# Patient Record
Sex: Female | Born: 2000 | Hispanic: Yes | Marital: Single | Smoking: Never smoker
Health system: Southern US, Community
[De-identification: ages and names within clinical notes are randomized; demographics above are authoritative.]

---
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
## 2022-03-29 ENCOUNTER — Other Ambulatory Visit: Payer: Self-pay | Admitting: Family Medicine

## 2022-03-29 DIAGNOSIS — F411 Generalized anxiety disorder: Secondary | ICD-10-CM

## 2022-03-30 ENCOUNTER — Encounter: Payer: Self-pay | Admitting: Family Medicine

## 2022-03-30 NOTE — Telephone Encounter (Signed)
[place] NTBS 30 days given 12/04/21

## 2022-03-30 NOTE — Telephone Encounter (Signed)
LMTCB TO SCHEDULE APPT LETTER MAILED 

## 2022-04-10 DIAGNOSIS — H5213 Myopia, bilateral: Secondary | ICD-10-CM | POA: Diagnosis not present

## 2022-05-04 ENCOUNTER — Ambulatory Visit (INDEPENDENT_AMBULATORY_CARE_PROVIDER_SITE_OTHER): Payer: Medicaid Other | Admitting: Family Medicine

## 2022-05-04 ENCOUNTER — Encounter: Payer: Self-pay | Admitting: Family Medicine

## 2022-05-04 VITALS — BP 117/73 | HR 57 | Ht 60.0 in | Wt 143.0 lb

## 2022-05-04 DIAGNOSIS — F411 Generalized anxiety disorder: Secondary | ICD-10-CM

## 2022-05-04 DIAGNOSIS — F339 Major depressive disorder, recurrent, unspecified: Secondary | ICD-10-CM | POA: Diagnosis not present

## 2022-05-04 DIAGNOSIS — Z23 Encounter for immunization: Secondary | ICD-10-CM | POA: Diagnosis not present

## 2022-05-04 MED ORDER — FLUOXETINE HCL 20 MG PO CAPS: 20.0000 mg | ORAL_CAPSULE | Freq: Every day | ORAL | 2 refills | Status: DC

## 2022-05-04 MED ORDER — HYDROXYZINE HCL 10 MG PO TABS: 10.0000 mg | ORAL_TABLET | Freq: Three times a day (TID) | ORAL | 0 refills | Status: DC | PRN
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
## 2022-05-29 ENCOUNTER — Encounter: Payer: Self-pay | Admitting: Family Medicine

## 2022-05-29 ENCOUNTER — Ambulatory Visit (INDEPENDENT_AMBULATORY_CARE_PROVIDER_SITE_OTHER): Payer: Medicaid Other | Admitting: Family Medicine

## 2022-05-29 VITALS — BP 118/76 | HR 60 | Ht 60.0 in | Wt 143.0 lb

## 2022-05-29 DIAGNOSIS — F339 Major depressive disorder, recurrent, unspecified: Secondary | ICD-10-CM | POA: Diagnosis not present

## 2022-05-29 DIAGNOSIS — F411 Generalized anxiety disorder: Secondary | ICD-10-CM | POA: Diagnosis not present

## 2022-05-29 MED ORDER — HYDROXYZINE HCL 10 MG PO TABS: 10.0000 mg | ORAL_TABLET | Freq: Three times a day (TID) | ORAL | 0 refills | Status: DC | PRN

## 2022-05-29 MED ORDER — FLUOXETINE HCL 20 MG PO CAPS: 20.0000 mg | ORAL_CAPSULE | Freq: Every day | ORAL | 1 refills | Status: DC

## 2022-05-29 NOTE — Progress Notes (Signed)
BP 118/76   Pulse 60   Ht 5' (1.524 m)   Wt 143 lb (64.9 kg)   SpO2 98%   BMI 27.93 kg/m    Subjective:   Patient ID: Holly Ortega, female    DOB: 11-01-2000, 22 y.o.   MRN: [id number]  HPI: Holly Ortega is a 22 y.o. female presenting on 05/29/2022 for Medical Management of Chronic Issues and Depression (Pt states that fluoxetine and hydroxyzine are helpful)   HPI Anxiety depression recheck Patient is coming in for anxiety depression recheck, she is currently on Prozac and using hydroxyzine as needed.  She feels like she is doing better, she feels like her anxiety is a lot better.  She feels like she is not having panic attacks or overwhelming anxiety like she was before and has only had to use the hydroxyzine twice in the past month and the most recent was 2 weeks ago.  She denies any suicidal ideations or thoughts of hurting herself.  She is still thinking about counseling and will consider it.    05/04/2022    3:36 PM 10/06/2021    4:20 PM 10/06/2021    3:51 PM 05/16/2021    8:52 AM 01/03/2021    4:54 PM  Depression screen PHQ 2/9  Decreased Interest 1 1 0 3 2  Down, Depressed, Hopeless 1 2 0 1 2  PHQ - 2 Score 2 3 0 4 4  Altered sleeping 2 3  3 3   Tired, decreased energy 3 3  3 3   Change in appetite 3 3  1 3   Feeling bad or failure about yourself  0 1  0 3  Trouble concentrating 3 3  3 3   Moving slowly or fidgety/restless 3 3  1 3   Suicidal thoughts 0 0  0 0  PHQ-9 Score 16 19  15 22   Difficult doing work/chores Very difficult Very difficult  Very difficult      Relevant past medical, surgical, family and social history reviewed and updated as indicated. Interim medical history since our last visit reviewed. Allergies and medications reviewed and updated.  Review of Systems  Constitutional:  Negative for chills and fever.  Eyes:  Negative for redness and visual disturbance.  Respiratory:  Negative for chest tightness and shortness of  breath.   Cardiovascular:  Negative for chest pain and leg swelling.  Musculoskeletal:  Negative for back pain and gait problem.  Skin:  Negative for rash.  Neurological:  Negative for dizziness, light-headedness and headaches.  Psychiatric/Behavioral:  Positive for dysphoric mood. Negative for agitation and behavioral problems. The patient is nervous/anxious.   All other systems reviewed and are negative.   Per HPI unless specifically indicated above   Allergies as of 05/29/2022   No Known Allergies      Medication List        Accurate as of May 29, 2022  3:27 PM. If you have any questions, ask your nurse or doctor.          FLUoxetine 20 MG capsule Commonly known as: PROzac Take 1 capsule (20 mg total) by mouth daily.   hydrOXYzine 10 MG tablet Commonly known as: ATARAX Take 1 tablet (10 mg total) by mouth 3 (three) times daily as needed.         Objective:   BP 118/76   Pulse 60   Ht 5' (1.524 m)   Wt 143 lb (64.9 kg)   SpO2 98%   BMI 27.93  kg/m   Wt Readings from Last 3 Encounters:  05/29/22 143 lb (64.9 kg) (73 %, Z= 0.60)*  05/04/22 143 lb (64.9 kg) (73 %, Z= 0.61)*  10/06/21 149 lb 9.6 oz (67.9 kg) (81 %, Z= 0.88)*   * Growth percentiles are based on CDC (Girls, 2-20 Years) data.    Physical Exam Vitals and nursing note reviewed.  Constitutional:      General: She is not in acute distress.    Appearance: She is well-developed. She is not diaphoretic.  Eyes:     Conjunctiva/sclera: Conjunctivae normal.  Cardiovascular:     Rate and Rhythm: Normal rate and regular rhythm.     Heart sounds: Normal heart sounds. No murmur heard. Pulmonary:     Effort: Pulmonary effort is normal. No respiratory distress.     Breath sounds: Normal breath sounds. No wheezing.  Musculoskeletal:        General: No swelling or tenderness. Normal range of motion.  Skin:    General: Skin is warm and dry.     Findings: No rash.  Neurological:     Mental Status:  She is alert and oriented to person, place, and time.     Coordination: Coordination normal.  Psychiatric:        Behavior: Behavior normal.       Assessment & Plan:   Problem List Items Addressed This Visit       Other   GAD (generalized anxiety disorder) - Primary   Relevant Medications   FLUoxetine (PROZAC) 20 MG capsule   hydrOXYzine (ATARAX) 10 MG tablet   Other Visit Diagnoses     Depression, recurrent ([facility])       Relevant Medications   FLUoxetine (PROZAC) 20 MG capsule   hydrOXYzine (ATARAX) 10 MG tablet       Continue current medicine, seems to be doing well.  No changes.  Discussed counseling again and will follow-up soon. Follow up plan: Return if symptoms worsen or fail to improve, for 1-2 months anxiety.  Counseling provided for all of the vaccine components No orders of the defined types were placed in this encounter.   Caryl Pina, MD [facility] 05/29/2022, 3:27 PM

## 2022-08-06 ENCOUNTER — Ambulatory Visit: Payer: Medicaid Other | Admitting: Family Medicine

## 2022-09-24 ENCOUNTER — Ambulatory Visit: Payer: Medicaid Other | Admitting: Family Medicine

## 2022-10-14 ENCOUNTER — Ambulatory Visit: Payer: Medicaid Other | Admitting: Family Medicine

## 2022-12-09 ENCOUNTER — Encounter: Payer: Self-pay | Admitting: Family Medicine

## 2022-12-09 ENCOUNTER — Ambulatory Visit: Payer: Medicaid Other | Admitting: Family Medicine

## 2022-12-09 VITALS — BP 104/70 | HR 60 | Ht 60.0 in | Wt 148.0 lb

## 2022-12-09 DIAGNOSIS — F339 Major depressive disorder, recurrent, unspecified: Secondary | ICD-10-CM | POA: Diagnosis not present

## 2022-12-09 DIAGNOSIS — Z23 Encounter for immunization: Secondary | ICD-10-CM

## 2022-12-09 DIAGNOSIS — F411 Generalized anxiety disorder: Secondary | ICD-10-CM

## 2022-12-09 MED ORDER — FLUOXETINE HCL 20 MG PO CAPS: 20.0000 mg | ORAL_CAPSULE | Freq: Every day | ORAL | 3 refills | Status: AC

## 2022-12-09 MED ORDER — HYDROXYZINE HCL 10 MG PO TABS: 10.0000 mg | ORAL_TABLET | Freq: Three times a day (TID) | ORAL | 6 refills | Status: AC | PRN

## 2022-12-09 NOTE — Progress Notes (Signed)
BP 104/70   Pulse 60   Ht 5' (1.524 m)   Wt 148 lb (67.1 kg)   SpO2 97%   BMI 28.90 kg/m    Subjective:   Patient ID: Holly Ortega, female    DOB: 02-Oct-2000, 22 y.o.   MRN: [id number]  HPI: Holly Ortega is a 22 y.o. female presenting on 12/09/2022 for Medical Management of Chronic Issues, Anxiety, and Depression   HPI Anxiety recheck Patient is coming in today for anxiety recheck.  She is currently taking Prozac and uses hydroxyzine as needed for anxiety attacks.  She has been out of her medicines for about 5 months she says and she has noticed a difference in her anxiety been a lot higher and having issues.  She does still finish with school and has been doing well in her job but still says she has some anxiety wants to go back on medicine.    12/09/2022   11:38 AM 05/29/2022    3:28 PM 05/04/2022    3:36 PM 10/06/2021    4:20 PM 10/06/2021    3:51 PM  Depression screen PHQ 2/9  Decreased Interest 1 1 1 1  0  Down, Depressed, Hopeless 1 1 1 2  0  PHQ - 2 Score 2 2 2 3  0  Altered sleeping 3 2 2 3    Tired, decreased energy 3 1 3 3    Change in appetite 1 2 3 3    Feeling bad or failure about yourself  1 0 0 1   Trouble concentrating 0 3 3 3    Moving slowly or fidgety/restless 0 0 3 3   Suicidal thoughts 0 0 0 0   PHQ-9 Score 10 10 16 19    Difficult doing work/chores Somewhat difficult Somewhat difficult Very difficult Very difficult      Relevant past medical, surgical, family and social history reviewed and updated as indicated. Interim medical history since our last visit reviewed. Allergies and medications reviewed and updated.  Review of Systems  Constitutional:  Negative for chills and fever.  Eyes:  Negative for redness and visual disturbance.  Respiratory:  Negative for chest tightness and shortness of breath.   Cardiovascular:  Negative for chest pain and leg swelling.  Skin:  Negative for rash.  Neurological:  Negative for  light-headedness and headaches.  Psychiatric/Behavioral:  Positive for dysphoric mood. Negative for agitation, behavioral problems, self-injury, sleep disturbance and suicidal ideas. The patient is nervous/anxious.   All other systems reviewed and are negative.   Per HPI unless specifically indicated above   Allergies as of 12/09/2022   No Known Allergies      Medication List        Accurate as of December 09, 2022 12:03 PM. If you have any questions, ask your nurse or doctor.          FLUoxetine 20 MG capsule Commonly known as: PROzac Take 1 capsule (20 mg total) by mouth daily.   hydrOXYzine 10 MG tablet Commonly known as: ATARAX Take 1 tablet (10 mg total) by mouth 3 (three) times daily as needed.         Objective:   BP 104/70   Pulse 60   Ht 5' (1.524 m)   Wt 148 lb (67.1 kg)   SpO2 97%   BMI 28.90 kg/m   Wt Readings from Last 3 Encounters:  12/09/22 148 lb (67.1 kg)  05/29/22 143 lb (64.9 kg) (73%, Z= 0.60)*  05/04/22 143 lb (64.9 kg) (  73%, Z= 0.61)*   * Growth percentiles are based on CDC (Girls, 2-20 Years) data.    Physical Exam Vitals and nursing note reviewed.  Constitutional:      General: She is not in acute distress.    Appearance: She is well-developed. She is not diaphoretic.  Eyes:     Conjunctiva/sclera: Conjunctivae normal.  Cardiovascular:     Rate and Rhythm: Normal rate and regular rhythm.     Heart sounds: Normal heart sounds. No murmur heard. Pulmonary:     Effort: Pulmonary effort is normal. No respiratory distress.     Breath sounds: Normal breath sounds. No wheezing.  Musculoskeletal:        General: No swelling. Normal range of motion.  Skin:    General: Skin is warm and dry.     Findings: No rash.  Neurological:     Mental Status: She is alert and oriented to person, place, and time.     Coordination: Coordination normal.  Psychiatric:        Behavior: Behavior normal.       Assessment & Plan:   Problem List  Items Addressed This Visit       Other   GAD (generalized anxiety disorder) - Primary   Relevant Medications   FLUoxetine (PROZAC) 20 MG capsule   hydrOXYzine (ATARAX) 10 MG tablet   Other Visit Diagnoses     Depression, recurrent ([facility])       Relevant Medications   FLUoxetine (PROZAC) 20 MG capsule   hydrOXYzine (ATARAX) 10 MG tablet     Refill medicine for including Prozac and hydroxyzine, seems to be doing well.   Follow up plan: Return in about 7 months (around 07/12/2023), or if symptoms worsen or fail to improve, for Well woman exam and Pap smear and recheck anxiety and depression.  Counseling provided for all of the vaccine components No orders of the defined types were placed in this encounter.   Arville Care, MD [facility] 12/09/2022, 12:03 PM

## 2022-12-22 DIAGNOSIS — R0981 Nasal congestion: Secondary | ICD-10-CM | POA: Diagnosis not present

## 2022-12-22 DIAGNOSIS — J069 Acute upper respiratory infection, unspecified: Secondary | ICD-10-CM | POA: Diagnosis not present

## 2023-01-19 ENCOUNTER — Other Ambulatory Visit: Payer: Self-pay | Admitting: Family Medicine

## 2023-01-19 ENCOUNTER — Ambulatory Visit (INDEPENDENT_AMBULATORY_CARE_PROVIDER_SITE_OTHER): Payer: Medicaid Other | Admitting: Family Medicine

## 2023-01-19 ENCOUNTER — Encounter: Payer: Self-pay | Admitting: Family Medicine

## 2023-01-19 VITALS — BP 98/63 | HR 73 | Temp 97.3°F | Ht 60.0 in | Wt 150.2 lb

## 2023-01-19 DIAGNOSIS — R300 Dysuria: Secondary | ICD-10-CM

## 2023-01-19 LAB — URINALYSIS, COMPLETE
Bilirubin, UA: NEGATIVE
Glucose, UA: NEGATIVE
Ketones, UA: NEGATIVE
Nitrite, UA: NEGATIVE
RBC, UA: NEGATIVE
Specific Gravity, UA: 1.02 (ref 1.005–1.030)
Urobilinogen, Ur: 0.2 mg/dL (ref 0.2–1.0)
pH, UA: 8.5 — ABNORMAL HIGH (ref 5.0–7.5)

## 2023-01-19 LAB — MICROSCOPIC EXAMINATION
RBC, Urine: NONE SEEN /[HPF] (ref 0–2)
Renal Epithel, UA: NONE SEEN /[HPF]
Yeast, UA: NONE SEEN

## 2023-01-19 MED ORDER — SULFAMETHOXAZOLE-TRIMETHOPRIM 800-160 MG PO TABS: 1.0000 | ORAL_TABLET | Freq: Two times a day (BID) | ORAL | 0 refills | Status: DC

## 2023-01-19 NOTE — Progress Notes (Signed)
Subjective:  Patient ID: Holly Ortega, female    DOB: 24-Jun-2000  Age: 21 y.o. MRN: [id number]  CC: Urinary Tract Infection   HPI Holly Ortega presents for 1-2 weeks of incomplete emptying, frequency. No pain. Occ. Urgency. No fever. Getting nauseated 2-3 times a week.      01/19/2023    3:12 PM 01/19/2023    2:35 PM 12/09/2022   11:38 AM  Depression screen PHQ 2/9  Decreased Interest 3 0 1  Down, Depressed, Hopeless 1 0 1  PHQ - 2 Score 4 0 2  Altered sleeping 3  3  Tired, decreased energy 1  3  Change in appetite 0  1  Feeling bad or failure about yourself  1  1  Trouble concentrating 2  0  Moving slowly or fidgety/restless 1  0  Suicidal thoughts 0  0  PHQ-9 Score 12  10  Difficult doing work/chores Somewhat difficult  Somewhat difficult    History Holly Ortega has no past medical history on file.   She has no past surgical history on file.   Her family history is not on file.She reports that she has never smoked. She has never used smokeless tobacco. She reports that she does not drink alcohol and does not use drugs.    ROS Review of Systems  Constitutional: Negative.   HENT: Negative.    Eyes:  Negative for visual disturbance.  Respiratory:  Negative for shortness of breath.   Cardiovascular:  Negative for chest pain.  Gastrointestinal:  Negative for abdominal pain.  Musculoskeletal:  Negative for arthralgias.    Objective:  BP 98/63   Pulse 73   Temp (!) 97.3 F (36.3 C)   Ht 5' (1.524 m)   Wt 150 lb 3.2 oz (68.1 kg)   SpO2 92%   BMI 29.33 kg/m   BP Readings from Last 3 Encounters:  01/19/23 98/63  12/09/22 104/70  05/29/22 118/76    Wt Readings from Last 3 Encounters:  01/19/23 150 lb 3.2 oz (68.1 kg)  12/09/22 148 lb (67.1 kg)  05/29/22 143 lb (64.9 kg) (73%, Z= 0.60)*   * Growth percentiles are based on CDC (Girls, 2-20 Years) data.     Physical Exam Constitutional:      General: She is not in acute  distress.    Appearance: She is well-developed.  Cardiovascular:     Rate and Rhythm: Normal rate and regular rhythm.  Pulmonary:     Breath sounds: Normal breath sounds.  Musculoskeletal:        General: Normal range of motion.  Skin:    General: Skin is warm and dry.  Neurological:     Mental Status: She is alert and oriented to person, place, and time.       Assessment & Plan:   Holly Ortega was seen today for urinary tract infection.  Diagnoses and all orders for this visit:  Dysuria -     Urinalysis, Complete -     Urine Culture  Other orders -     sulfamethoxazole-trimethoprim (BACTRIM DS) 800-160 MG tablet; Take 1 tablet by mouth 2 (two) times daily.       I am having Holly Ortega start on sulfamethoxazole-trimethoprim. I am also having her maintain her FLUoxetine and hydrOXYzine.  Allergies as of 01/19/2023   No Known Allergies      Medication List        Accurate as of January 19, 2023  4:21 PM.  If you have any questions, ask your nurse or doctor.          FLUoxetine 20 MG capsule Commonly known as: PROzac Take 1 capsule (20 mg total) by mouth daily.   hydrOXYzine 10 MG tablet Commonly known as: ATARAX Take 1 tablet (10 mg total) by mouth 3 (three) times daily as needed.   sulfamethoxazole-trimethoprim 800-160 MG tablet Commonly known as: BACTRIM DS Take 1 tablet by mouth 2 (two) times daily. Started by: Broadus John Zarion Oliff         Follow-up: Return if symptoms worsen or fail to improve.  Mechele Claude, M.D.

## 2023-01-21 LAB — URINE CULTURE: Organism ID, Bacteria: NO GROWTH

## 2023-01-25 ENCOUNTER — Encounter: Payer: Self-pay | Admitting: Family Medicine

## 2023-01-25 ENCOUNTER — Ambulatory Visit: Payer: Medicaid Other | Admitting: Family

## 2023-01-25 ENCOUNTER — Encounter: Payer: Self-pay | Admitting: Family

## 2023-01-25 VITALS — BP 112/70 | HR 102 | Temp 98.2°F | Ht 60.0 in | Wt 147.8 lb

## 2023-01-25 DIAGNOSIS — J069 Acute upper respiratory infection, unspecified: Secondary | ICD-10-CM | POA: Diagnosis not present

## 2023-01-25 DIAGNOSIS — R509 Fever, unspecified: Secondary | ICD-10-CM | POA: Diagnosis not present

## 2023-01-25 LAB — VERITOR FLU A/B WAIVED
Influenza A: NEGATIVE
Influenza B: NEGATIVE

## 2023-01-25 LAB — RAPID STREP SCREEN (MED CTR MEBANE ONLY): Strep Gp A Ag, IA W/Reflex: NEGATIVE

## 2023-01-25 LAB — RSV AG, IMMUNOCHR, WAIVED: RSV Ag, Immunochr, Waived: NEGATIVE

## 2023-01-25 LAB — CULTURE, GROUP A STREP

## 2023-01-25 MED ORDER — CETIRIZINE HCL 10 MG PO TABS: 10.0000 mg | ORAL_TABLET | Freq: Every day | ORAL | 1 refills | Status: DC

## 2023-01-25 MED ORDER — FLUTICASONE PROPIONATE 50 MCG/ACT NA SUSP: 2.0000 | Freq: Every day | NASAL | 6 refills | Status: DC

## 2023-01-25 MED ORDER — BENZONATATE 200 MG PO CAPS: 200.0000 mg | ORAL_CAPSULE | Freq: Three times a day (TID) | ORAL | 1 refills | Status: DC | PRN

## 2023-01-25 MED ORDER — ALBUTEROL SULFATE HFA 108 (90 BASE) MCG/ACT IN AERS: 2.0000 | INHALATION_SPRAY | Freq: Four times a day (QID) | RESPIRATORY_TRACT | 0 refills | Status: DC | PRN

## 2023-01-25 NOTE — Patient Instructions (Signed)

## 2023-01-25 NOTE — Progress Notes (Signed)
Subjective:    Patient ID: Holly Ortega, female    DOB: April 16, 2000, 22 y.o.   MRN: [id number]  Chief Complaint  Patient presents with   Fever   Sore Throat   Generalized Body Aches   PT presents to the office today with cough, sore throat, and body aches that started 4 days ago.  Fever  Associated symptoms include congestion, coughing and headaches. Pertinent negatives include no ear pain.  Sore Throat  This is a new problem. The current episode started in the past 7 days. The problem has been gradually worsening. The pain is worse on the left side. Maximum temperature: felt hot. The pain is at a severity of 5/10. The pain is moderate. Associated symptoms include congestion, coughing, headaches, shortness of breath, swollen glands and trouble swallowing. Pertinent negatives include no ear discharge or ear pain. She has tried acetaminophen and NSAIDs for the symptoms. The treatment provided mild relief.      Review of Systems  Constitutional:  Positive for fever.  HENT:  Positive for congestion and trouble swallowing. Negative for ear discharge and ear pain.   Respiratory:  Positive for cough and shortness of breath.   Neurological:  Positive for headaches.  All other systems reviewed and are negative.      Objective:   Physical Exam Vitals reviewed.  Constitutional:      General: She is not in acute distress.    Appearance: She is well-developed.  HENT:     Head: Normocephalic and atraumatic.     Comments: Tonsils erythemas     Right Ear: Tympanic membrane normal.     Left Ear: There is impacted cerumen.     Nose: Congestion and rhinorrhea present.  Eyes:     Pupils: Pupils are equal, round, and reactive to light.  Neck:     Thyroid: No thyromegaly.  Cardiovascular:     Rate and Rhythm: Normal rate and regular rhythm.     Heart sounds: Normal heart sounds. No murmur heard. Pulmonary:     Effort: Pulmonary effort is normal. No respiratory distress.      Breath sounds: Normal breath sounds. No wheezing.     Comments: Intermittent coarse nonproductive cough Abdominal:     General: Bowel sounds are normal. There is no distension.     Palpations: Abdomen is soft.     Tenderness: There is no abdominal tenderness.  Musculoskeletal:        General: No tenderness. Normal range of motion.     Cervical back: Normal range of motion and neck supple.  Skin:    General: Skin is warm and dry.  Neurological:     Mental Status: She is alert and oriented to person, place, and time.     Cranial Nerves: No cranial nerve deficit.     Deep Tendon Reflexes: Reflexes are normal and symmetric.  Psychiatric:        Behavior: Behavior normal.        Thought Content: Thought content normal.        Judgment: Judgment normal.       BP 112/70   Pulse (!) 102   Temp 98.2 F (36.8 C) (Temporal)   Ht 5' (1.524 m)   Wt 147 lb 12.8 oz (67 kg)   SpO2 93%   BMI 28.87 kg/m      Assessment & Plan:  Holly Ortega comes in today with chief complaint of Fever, Sore Throat, and Generalized Body Aches  Diagnosis and orders addressed:  1. Fever, unspecified fever cause - Novel Coronavirus, NAA ([facility]) - Veritor Flu A/B Waived - Rapid Strep Screen ([facility] ONLY) - RSV Ag, Immunochr, Waived  2. Viral URI with cough - Take meds as prescribed - Use a cool mist humidifier  -Use saline nose sprays frequently -Force fluids -For any cough or congestion  Use plain Mucinex- regular strength or max strength is fine -For fever or aces or pains- take tylenol or ibuprofen. -Throat lozenges if help -Follow up if symptoms worsen or do not improve - albuterol (VENTOLIN HFA) 108 (90 Base) MCG/ACT inhaler; Inhale 2 puffs into the lungs every 6 (six) hours as needed for wheezing or shortness of breath.  Dispense: 8 g; Refill: 0 - benzonatate (TESSALON) 200 MG capsule; Take 1 capsule (200 mg total) by mouth 3 (three) times daily as needed.  Dispense:  30 capsule; Refill: 1 - cetirizine (ZYRTEC ALLERGY) 10 MG tablet; Take 1 tablet (10 mg total) by mouth daily.  Dispense: 90 tablet; Refill: 1 - fluticasone (FLONASE) 50 MCG/ACT nasal spray; Place 2 sprays into both nostrils daily.  Dispense: 16 g; Refill: 6   Jannifer Rodney, FNP

## 2023-01-26 LAB — NOVEL CORONAVIRUS, NAA: SARS-CoV-2, NAA: NOT DETECTED

## 2023-03-23 ENCOUNTER — Ambulatory Visit: Payer: Medicaid Other

## 2023-03-23 ENCOUNTER — Encounter: Payer: Self-pay | Admitting: Family

## 2023-03-23 VITALS — BP 108/75 | HR 77 | Temp 97.5°F | Ht 60.0 in | Wt 147.0 lb

## 2023-03-23 DIAGNOSIS — J209 Acute bronchitis, unspecified: Secondary | ICD-10-CM | POA: Diagnosis not present

## 2023-03-23 MED ORDER — PREDNISONE 10 MG (21) PO TBPK: ORAL_TABLET | ORAL | 0 refills | Status: DC

## 2023-03-23 MED ORDER — BENZONATATE 200 MG PO CAPS: 200.0000 mg | ORAL_CAPSULE | Freq: Three times a day (TID) | ORAL | 1 refills | Status: DC | PRN

## 2023-03-23 NOTE — Patient Instructions (Signed)

## 2023-03-23 NOTE — Progress Notes (Signed)
 Subjective:    Patient ID: Holly Ortega, female    DOB: 02-23-2001, 23 y.o.   MRN: [id number]  Chief Complaint  Patient presents with   Cough    Since November    Nasal Congestion   PT presents to the office today with cough and congestion. She was seen on 01/25/23 and was diagnosed with bronchitis. She as given albuterol , tessalon , zyrtec , and flonase . She reports she started feeling better. However, when it gets cold she will continue to get coughing spells.  Cough This is a new problem. The current episode started more than 1 month ago. The problem has been waxing and waning. The problem occurs every few minutes. The cough is Non-productive. Associated symptoms include ear pain, headaches, myalgias, shortness of breath and wheezing. Pertinent negatives include no chills, ear congestion, fever, nasal congestion, postnasal drip or sore throat. She has tried OTC inhaler and rest for the symptoms. The treatment provided mild relief.      Review of Systems  Constitutional:  Negative for chills and fever.  HENT:  Positive for ear pain. Negative for postnasal drip and sore throat.   Respiratory:  Positive for cough, shortness of breath and wheezing.   Musculoskeletal:  Positive for myalgias.  Neurological:  Positive for headaches.  All other systems reviewed and are negative.      Objective:   Physical Exam Vitals reviewed.  Constitutional:      General: She is not in acute distress.    Appearance: She is well-developed.  HENT:     Head: Normocephalic and atraumatic.     Right Ear: Tympanic membrane normal.     Left Ear: Tympanic membrane normal.  Eyes:     Pupils: Pupils are equal, round, and reactive to light.  Neck:     Thyroid: No thyromegaly.  Cardiovascular:     Rate and Rhythm: Normal rate and regular rhythm.     Heart sounds: Normal heart sounds. No murmur heard. Pulmonary:     Effort: Pulmonary effort is normal. No respiratory distress.     Breath  sounds: Normal breath sounds. No wheezing.     Comments: Dry nonproductive cough  Abdominal:     General: Bowel sounds are normal. There is no distension.     Palpations: Abdomen is soft.     Tenderness: There is no abdominal tenderness.  Musculoskeletal:        General: No tenderness. Normal range of motion.     Cervical back: Normal range of motion and neck supple.  Skin:    General: Skin is warm and dry.  Neurological:     Mental Status: She is alert and oriented to person, place, and time.     Cranial Nerves: No cranial nerve deficit.     Deep Tendon Reflexes: Reflexes are normal and symmetric.  Psychiatric:        Behavior: Behavior normal.        Thought Content: Thought content normal.        Judgment: Judgment normal.     BP 108/75   Pulse 77   Temp (!) 97.5 F (36.4 C) (Temporal)   Ht 5' (1.524 m)   Wt 147 lb (66.7 kg)   SpO2 98%   BMI 28.71 kg/m        Assessment & Plan:  [name] comes in today with chief complaint of Cough (Since November ) and Nasal Congestion   Diagnosis and orders addressed:  1. Acute bronchitis,  unspecified organism (Primary) - Take meds as prescribed - Use a cool mist humidifier  -Use saline nose sprays frequently -Force fluids -For any cough or congestion  Use plain Mucinex- regular strength or max strength is fine -For fever or aces or pains- take tylenol or ibuprofen. -Throat lozenges if help -Follow up if symptoms worsen or do not improve  - predniSONE  (STERAPRED UNI-PAK 21 TAB) 10 MG (21) TBPK tablet; Use as directed  Dispense: 21 tablet; Refill: 0 - benzonatate  (TESSALON ) 200 MG capsule; Take 1 capsule (200 mg total) by mouth 3 (three) times daily as needed.  Dispense: 30 capsule; Refill: 1  Bari Learn, FNP

## 2023-06-14 DIAGNOSIS — N390 Urinary tract infection, site not specified: Secondary | ICD-10-CM | POA: Diagnosis not present

## 2023-06-16 ENCOUNTER — Telehealth: Payer: Self-pay

## 2023-06-16 DIAGNOSIS — Z1283 Encounter for screening for malignant neoplasm of skin: Secondary | ICD-10-CM

## 2023-06-16 NOTE — Telephone Encounter (Unsigned)
 Copied from CRM [id number]. Topic: Referral - Request for Referral >> Jun 16, 2023 11:35 AM Priscille Loveless wrote: Did the patient discuss referral with their provider in the last year? Yes (If No - schedule appointment) (If Yes - send message)  Appointment offered? Yes  Type of order/referral and detailed reason for visit: Dermatology - (in [place]) Preference of office, provider, location: [facility]  If referral order, have you been seen by this specialty before? Yes (If Yes, this issue or another issue? When? Where?  Can we respond through MyChart? Yes

## 2023-06-16 NOTE — Telephone Encounter (Signed)
 Wants referral for mole on her right calf. States that it is tender. Advised pt that she may need an appt since Dettinger has not seen her since Oct 2024.   Dr. Louanne Skye are you alright to put in referral?

## 2023-06-17 NOTE — Telephone Encounter (Signed)
 Patient will have to be seen here, please make an appointment ASAP, we can look at it and see if we can treat it and if we cannot and we can send her on

## 2023-06-17 NOTE — Telephone Encounter (Signed)
 Appt made for 4/11 at 3:25pm. Pt made aware.

## 2023-06-18 ENCOUNTER — Encounter: Payer: Self-pay | Admitting: Family Medicine

## 2023-06-18 ENCOUNTER — Ambulatory Visit (INDEPENDENT_AMBULATORY_CARE_PROVIDER_SITE_OTHER): Admitting: Family Medicine

## 2023-06-18 VITALS — BP 119/79 | HR 61 | Ht 60.0 in | Wt 149.0 lb

## 2023-06-18 DIAGNOSIS — L700 Acne vulgaris: Secondary | ICD-10-CM

## 2023-06-18 DIAGNOSIS — D239 Other benign neoplasm of skin, unspecified: Secondary | ICD-10-CM

## 2023-06-18 NOTE — Progress Notes (Signed)
 BP 119/79   Pulse 61   Ht 5' (1.524 m)   Wt 149 lb (67.6 kg)   SpO2 99%   BMI 29.10 kg/m    Subjective:   Patient ID: Holly Ortega, female    DOB: Jun 13, 2000, 22 y.o.   MRN: [id number]  HPI: Holly Ortega is a 23 y.o. female presenting on 06/18/2023 for Nevus and Abdominal Pain (calf)   HPI Skin lesion Patient is still using on her inner calf on her right leg that she has had for couple years but is starting to bother irritated she will see about seeing dermatology to get removed.  Acne Patient is also been having some trouble with acne on her chin and forehead and she wants to see dermatology to discuss that as well.  Relevant past medical, surgical, family and social history reviewed and updated as indicated. Interim medical history since our last visit reviewed. Allergies and medications reviewed and updated.  Review of Systems  Constitutional:  Negative for chills and fever.  Eyes:  Negative for redness and visual disturbance.  Respiratory:  Negative for chest tightness and shortness of breath.   Cardiovascular:  Negative for chest pain and leg swelling.  Genitourinary:  Negative for difficulty urinating and dysuria.  Musculoskeletal:  Negative for back pain and gait problem.  Skin:  Positive for rash.  Neurological:  Negative for light-headedness and headaches.  Psychiatric/Behavioral:  Negative for agitation and behavioral problems.   All other systems reviewed and are negative.   Per HPI unless specifically indicated above   Allergies as of 06/18/2023   No Known Allergies      Medication List        Accurate as of June 18, 2023  3:48 PM. If you have any questions, ask your nurse or doctor.          STOP taking these medications    albuterol 108 (90 Base) MCG/ACT inhaler Commonly known as: VENTOLIN HFA Stopped by: Elige Radon Maricella Filyaw   benzonatate 200 MG capsule Commonly known as: TESSALON Stopped by: Elige Radon  Osiel Stick   predniSONE 10 MG (21) Tbpk tablet Commonly known as: STERAPRED UNI-PAK 21 TAB Stopped by: Ivin Booty A Gwenna Fuston       TAKE these medications    FLUoxetine 20 MG capsule Commonly known as: PROzac Take 1 capsule (20 mg total) by mouth daily.   hydrOXYzine 10 MG tablet Commonly known as: ATARAX Take 1 tablet (10 mg total) by mouth 3 (three) times daily as needed.         Objective:   BP 119/79   Pulse 61   Ht 5' (1.524 m)   Wt 149 lb (67.6 kg)   SpO2 99%   BMI 29.10 kg/m   Wt Readings from Last 3 Encounters:  06/18/23 149 lb (67.6 kg)  03/23/23 147 lb (66.7 kg)  01/25/23 147 lb 12.8 oz (67 kg)    Physical Exam Vitals and nursing note reviewed.  Constitutional:      General: She is not in acute distress.    Appearance: She is well-developed. She is not diaphoretic.  Eyes:     Conjunctiva/sclera: Conjunctivae normal.  Skin:    General: Skin is warm and dry.     Findings: Acne (Few scattered acne on chin and forehead.) and lesion (Dermatofibroma on right inner calf, slightly tender) present. No rash.  Neurological:     Mental Status: She is alert and oriented to person, place, and time.  Coordination: Coordination normal.  Psychiatric:        Behavior: Behavior normal.       Assessment & Plan:   Problem List Items Addressed This Visit   None Visit Diagnoses       Dermatofibroma    -  Primary   Relevant Orders   Ambulatory referral to Dermatology     Acne vulgaris       Relevant Orders   Ambulatory referral to Dermatology       Placed referral to dermatology.   Follow up plan: Return if symptoms worsen or fail to improve.  Counseling provided for all of the vaccine components Orders Placed This Encounter  Procedures   Ambulatory referral to Dermatology    Arville Care, MD [facility] 06/18/2023, 3:48 PM

## 2023-06-20 DIAGNOSIS — N760 Acute vaginitis: Secondary | ICD-10-CM | POA: Diagnosis not present

## 2023-06-20 DIAGNOSIS — N390 Urinary tract infection, site not specified: Secondary | ICD-10-CM | POA: Diagnosis not present

## 2023-07-09 ENCOUNTER — Encounter: Payer: Self-pay | Admitting: Family Medicine

## 2023-07-09 ENCOUNTER — Other Ambulatory Visit (HOSPITAL_COMMUNITY)
Admission: RE | Admit: 2023-07-09 | Discharge: 2023-07-09 | Disposition: A | Discharge status: Home / Self Care | Source: Ambulatory Visit | Attending: Family Medicine | Admitting: Family Medicine

## 2023-07-09 ENCOUNTER — Ambulatory Visit (INDEPENDENT_AMBULATORY_CARE_PROVIDER_SITE_OTHER): Payer: Medicaid Other | Admitting: Family Medicine

## 2023-07-09 VITALS — BP 107/75 | HR 60 | Ht 60.0 in | Wt 148.0 lb

## 2023-07-09 DIAGNOSIS — Z113 Encounter for screening for infections with a predominantly sexual mode of transmission: Secondary | ICD-10-CM | POA: Insufficient documentation

## 2023-07-09 DIAGNOSIS — Z01419 Encounter for gynecological examination (general) (routine) without abnormal findings: Secondary | ICD-10-CM | POA: Diagnosis present

## 2023-07-09 DIAGNOSIS — Z01411 Encounter for gynecological examination (general) (routine) with abnormal findings: Secondary | ICD-10-CM

## 2023-07-09 DIAGNOSIS — F411 Generalized anxiety disorder: Secondary | ICD-10-CM | POA: Diagnosis not present

## 2023-07-09 DIAGNOSIS — M546 Pain in thoracic spine: Secondary | ICD-10-CM | POA: Diagnosis not present

## 2023-07-09 DIAGNOSIS — Z124 Encounter for screening for malignant neoplasm of cervix: Secondary | ICD-10-CM | POA: Insufficient documentation

## 2023-07-09 NOTE — Progress Notes (Signed)
 BP 107/75   Pulse 60   Ht 5' (1.524 m)   Wt 148 lb (67.1 kg)   LMP 06/23/2023 (Approximate)   SpO2 96%   BMI 28.90 kg/m    Subjective:   Patient ID: Holly Ortega, female    DOB: 15-Feb-2001, 22 y.o.   MRN: [id number]  HPI: Holly Ortega is a 23 y.o. female presenting on 07/09/2023 for Medical Management of Chronic Issues (CPE with pap), Anxiety, and Depression   HPI Well woman examine and physical Patient denies any chest pain, shortness of breath, headaches or vision issues, abdominal complaints, diarrhea, nausea, vomiting, or joint issues.  Patient is set to be active with female partner and has been in a relationship for at least a few months.  She denies any vaginal issues or discharge outside of her normal.  She denies any breast lumps or bumps.  Anxiety recheck Patient feels like her anxiety is doing really well and she is happy with the fluoxetine  and would like to continue on what she is currently taking.  She denies any suicidal ideations or thoughts of hurting herself.    07/09/2023   10:54 AM 06/18/2023    3:29 PM 03/23/2023    8:56 AM 01/25/2023   11:14 AM 01/19/2023    3:12 PM  Depression screen PHQ 2/9  Decreased Interest 0 2 1 1 3   Down, Depressed, Hopeless 0 0 1 1 1   PHQ - 2 Score 0 2 2 2 4   Altered sleeping 2 3 2 2 3   Tired, decreased energy 2 2 2 2 1   Change in appetite 1 1 1 1  0  Feeling bad or failure about yourself  0 0 0 0 1  Trouble concentrating 2 2 1 1 2   Moving slowly or fidgety/restless 0 1 0 0 1  Suicidal thoughts 0 0 0 0 0  PHQ-9 Score 7 11 8 8 12   Difficult doing work/chores Somewhat difficult Somewhat difficult Somewhat difficult Somewhat difficult Somewhat difficult    Patient comes in today complaining of some mid back pain that is been bothering her off and on, sometimes she feels like she may get a little nausea with it but that she is also get cramping with it.  She denies any urinary problems burning or pain with  urination or bowel issues.  Relevant past medical, surgical, family and social history reviewed and updated as indicated. Interim medical history since our last visit reviewed. Allergies and medications reviewed and updated.  Review of Systems  Constitutional:  Negative for chills and fever.  HENT:  Negative for congestion, ear discharge and ear pain.   Eyes:  Negative for visual disturbance.  Respiratory:  Negative for chest tightness and shortness of breath.   Cardiovascular:  Negative for chest pain and leg swelling.  Genitourinary:  Negative for difficulty urinating and dysuria.  Musculoskeletal:  Positive for back pain. Negative for arthralgias and gait problem.  Skin:  Negative for rash.  Neurological:  Negative for light-headedness and headaches.  Psychiatric/Behavioral:  Negative for agitation and behavioral problems.   All other systems reviewed and are negative.   Per HPI unless specifically indicated above   Allergies as of 07/09/2023   No Known Allergies      Medication List        Accurate as of Jul 09, 2023 11:10 AM. If you have any questions, ask your nurse or doctor.          FLUoxetine  20 MG  capsule Commonly known as: PROzac  Take 1 capsule (20 mg total) by mouth daily.   hydrOXYzine  10 MG tablet Commonly known as: ATARAX  Take 1 tablet (10 mg total) by mouth 3 (three) times daily as needed.         Objective:   BP 107/75   Pulse 60   Ht 5' (1.524 m)   Wt 148 lb (67.1 kg)   LMP 06/23/2023 (Approximate)   SpO2 96%   BMI 28.90 kg/m   Wt Readings from Last 3 Encounters:  07/09/23 148 lb (67.1 kg)  06/18/23 149 lb (67.6 kg)  03/23/23 147 lb (66.7 kg)    Physical Exam Vitals and nursing note reviewed. Exam conducted with a chaperone present.  Constitutional:      General: She is not in acute distress.    Appearance: She is well-developed. She is not diaphoretic.  Eyes:     Conjunctiva/sclera: Conjunctivae normal.     Pupils: Pupils are  equal, round, and reactive to light.  Neck:     Thyroid: No thyromegaly.  Cardiovascular:     Rate and Rhythm: Normal rate and regular rhythm.     Heart sounds: Normal heart sounds. No murmur heard. Pulmonary:     Effort: Pulmonary effort is normal. No respiratory distress.     Breath sounds: Normal breath sounds. No wheezing.  Chest:  Breasts:    Breasts are symmetrical.     Right: No inverted nipple, mass, nipple discharge, skin change or tenderness.     Left: Inverted nipple (Inverted nipple on left breast but patient says it has been there for as long as she remembers) present. No mass, nipple discharge, skin change or tenderness.  Abdominal:     General: Bowel sounds are normal. There is no distension.     Palpations: Abdomen is soft.     Tenderness: There is no abdominal tenderness. There is no guarding or rebound.  Genitourinary:    Exam position: Lithotomy position.     Labia:        Right: No rash or lesion.        Left: No rash or lesion.      Vagina: Normal.     Cervix: Discharge (Small amounts of white thin discharge, no odor) present. No cervical motion tenderness or friability.     Uterus: Not deviated, not enlarged, not fixed and not tender.      Adnexa:        Right: No mass or tenderness.         Left: No mass or tenderness.    Musculoskeletal:        General: Tenderness present. No swelling. Normal range of motion.       Arms:     Cervical back: Neck supple.  Lymphadenopathy:     Cervical: No cervical adenopathy.  Skin:    General: Skin is warm and dry.     Findings: No rash.  Neurological:     Mental Status: She is alert and oriented to person, place, and time.     Coordination: Coordination normal.  Psychiatric:        Behavior: Behavior normal.       Assessment & Plan:   Problem List Items Addressed This Visit       Other   GAD (generalized anxiety disorder)   Other Visit Diagnoses       Well woman exam with routine gynecological exam     -  Primary   Relevant Orders  Cytology - PAP([facility])     Acute bilateral thoracic back pain           Will give lift of exercises for her back pain.  Patient declined STD screen outside of the swab today. Follow up plan: Return in about 6 months (around 01/09/2024), or if symptoms worsen or fail to improve, for Anxiety recheck.  Counseling provided for all of the vaccine components No orders of the defined types were placed in this encounter.   Jolyne Needs, MD [facility] 07/09/2023, 11:10 AM

## 2023-07-14 ENCOUNTER — Encounter: Payer: Self-pay | Admitting: Family Medicine

## 2023-07-14 LAB — CYTOLOGY - PAP
Chlamydia: NEGATIVE
Comment: NEGATIVE
Comment: NORMAL
Diagnosis: NEGATIVE
Neisseria Gonorrhea: NEGATIVE

## 2023-08-14 DIAGNOSIS — U071 COVID-19: Secondary | ICD-10-CM | POA: Diagnosis not present

## 2023-08-14 DIAGNOSIS — R062 Wheezing: Secondary | ICD-10-CM | POA: Diagnosis not present

## 2023-08-14 DIAGNOSIS — R509 Fever, unspecified: Secondary | ICD-10-CM | POA: Diagnosis not present

## 2023-08-14 DIAGNOSIS — J029 Acute pharyngitis, unspecified: Secondary | ICD-10-CM | POA: Diagnosis not present

## 2023-09-02 ENCOUNTER — Ambulatory Visit: Payer: Self-pay

## 2023-09-02 NOTE — Telephone Encounter (Signed)
  FYI Only or Action Required?: FYI only for provider.  Patient was last seen in primary care on 07/09/2023 by Dettinger, Fonda LABOR, MD. Called Nurse Triage reporting Groin Swelling. Symptoms began yesterday. Interventions attempted: Nothing. Symptoms are: unchanged.  Triage Disposition: See Physician Within 24 Hours  Patient/caregiver understands and will follow disposition?: Yes         Cyst on labia?  May need test           Reason for Disposition  [1] MILD-MODERATE pain AND [2] present > 24 hours  (Exception: Chronic pain.)  Answer Assessment - Initial Assessment Questions 1. SYMPTOM: What's the main symptom you're concerned about? (e.g., pain, itching, dryness)     Cyst on left side of labia 2. LOCATION: Where is the  cyst located? (e.g., inside/outside, left/right)     Left side of labia 3. ONSET: When did the  cyst  start?     yesterday 4. PAIN: Is there any pain? If Yes, ask: How bad is it? (Scale: 1-10; mild, moderate, severe)   -  MILD (1-3): Doesn't interfere with normal activities.    -  MODERATE (4-7): Interferes with normal activities (e.g., work or school) or awakens from sleep.     -  SEVERE (8-10): Excruciating pain, unable to do any normal activities.     mild 5. ITCHING: Is there any itching? If Yes, ask: How bad is it? (Scale: 1-10; mild, moderate, severe)     denies 6. CAUSE: What do you think is causing the discharge? Have you had the same problem before? What happened then?     denies 7. OTHER SYMPTOMS: Do you have any other symptoms? (e.g., fever, itching, vaginal bleeding, pain with urination, injury to genital area, vaginal foreign body)     no 8. PREGNANCY: Is there any chance you are pregnant? When was your last menstrual period?     no  Protocols used: Vaginal Symptoms-A-AH

## 2023-09-03 ENCOUNTER — Encounter: Payer: Self-pay | Admitting: Family Medicine

## 2023-09-03 ENCOUNTER — Ambulatory Visit: Admitting: Family Medicine

## 2023-09-03 VITALS — BP 104/69 | HR 66 | Temp 98.4°F | Ht 60.0 in | Wt 147.2 lb

## 2023-09-03 DIAGNOSIS — N907 Vulvar cyst: Secondary | ICD-10-CM | POA: Diagnosis not present

## 2023-09-03 DIAGNOSIS — Z113 Encounter for screening for infections with a predominantly sexual mode of transmission: Secondary | ICD-10-CM | POA: Diagnosis not present

## 2023-09-03 MED ORDER — DOXYCYCLINE HYCLATE 100 MG PO TABS: 100.0000 mg | ORAL_TABLET | Freq: Two times a day (BID) | ORAL | 0 refills | Status: AC

## 2023-09-03 NOTE — Progress Notes (Signed)
 Subjective: CC: Vulvar lesion PCP: Dettinger, Fonda LABOR, MD Holly Ortega is a 23 y.o. female presenting to clinic today for:  1.  Vulvar lesion Patient reports a 3-day history of a lump on the left upper aspect of her vulva.  She notes that it was initially very painful but is gotten smaller over the last couple of days and now is just more of an irritated itchy area.  She reports no drainage.  No fevers.  She is just been keeping the area clean.  She was sexually active about 1 month ago and this was unprotected.  Since that time she has had her Pap smear with gonorrhea and Chlamydia testing which was negative.  She has had no blood testing for STIs but does want to go ahead and proceed with this.  LMP was normal and Patient's last menstrual period was 08/18/2023.   ROS: Per HPI  No Known Allergies History reviewed. No pertinent past medical history.  Current Outpatient Medications:    FLUoxetine  (PROZAC ) 20 MG capsule, Take 1 capsule (20 mg total) by mouth daily., Disp: 90 capsule, Rfl: 3   hydrOXYzine  (ATARAX ) 10 MG tablet, Take 1 tablet (10 mg total) by mouth 3 (three) times daily as needed., Disp: 30 tablet, Rfl: 6 Social History   Socioeconomic History   Marital status: Single    Spouse name: Not on file   Number of children: Not on file   Years of education: Not on file   Highest education level: Not on file  Occupational History   Not on file  Tobacco Use   Smoking status: Never   Smokeless tobacco: Never  Vaping Use   Vaping status: Never Used  Substance and Sexual Activity   Alcohol use: No   Drug use: No   Sexual activity: Not on file  Other Topics Concern   Not on file  Social History Narrative   Not on file   Social Drivers of Health   Financial Resource Strain: Low Risk  (08/14/2023)   Received from [facility]   Overall Financial Resource Strain (CARDIA)    Difficulty of Paying Living Expenses: Not hard at all  Recent Concern:  Financial Resource Strain - Medium Risk (07/08/2023)   Overall Financial Resource Strain (CARDIA)    Difficulty of Paying Living Expenses: Somewhat hard  Food Insecurity: No Food Insecurity (08/14/2023)   Received from [facility]   Hunger Vital Sign    Within the past 12 months, you worried that your food would run out before you got the money to buy more.: Never true    Within the past 12 months, the food you bought just didn't last and you didn't have money to get more.: Never true  Transportation Needs: No Transportation Needs (08/14/2023)   Received from [facility] - [facility]    Lack of Transportation (Medical): No    Lack of Transportation (Non-Medical): No  Physical Activity: Sufficiently Active (07/08/2023)   Exercise Vital Sign    Days of Exercise per Week: 4 days    Minutes of Exercise per Session: 80 min  Stress: Stress Concern Present (07/08/2023)   [employer] - Occupational Stress Questionnaire    Feeling of Stress : To some extent  Social Connections: Unknown (07/08/2023)   Social Connection and Isolation Panel    Frequency of Communication with Friends and Family: More than three times a week    Frequency of Social Gatherings with Friends and  Family: Three times a week    Attends Religious Services: More than 4 times per year    Active Member of Clubs or Organizations: No    Attends Banker Meetings: Not on file    Marital Status: Patient declined  Intimate Partner Violence: Unknown (06/13/2021)   Received from [facility]   HITS    Physically Hurt: Not on file    Insult or Talk Down To: Not on file    Threaten Physical Harm: Not on file    Scream or Curse: Not on file   History reviewed. No pertinent family history.  Objective: Office vital signs reviewed. BP 104/69   Pulse 66   Temp 98.4 F (36.9 C)   Ht 5' (1.524 m)   Wt 147 lb 3.2 oz (66.8 kg)   LMP 08/18/2023   SpO2 99%   BMI 28.75 kg/m    Physical Examination:  General: Awake, alert, well nourished, No acute distress HEENT: sclera white GU: external vaginal tissue normal.  Left apex of vulva with pea-sized cystic lesion without appreciable induration.  No central punctum, erythema etc.    Assessment/ Plan: 23 y.o. female   Vulvar cyst - Plan: doxycycline (VIBRA-TABS) 100 MG tablet  Screen for STD (sexually transmitted disease) - Plan: Hepatitis C antibody, HIV antibody (with reflex), RPR, HSV 1 and 2 Ab, IgG  Suspect this is a vulvar cyst.  Whilst it does not look grossly infected I am going to go ahead and empirically treat her with antibiotics given upcoming holiday week.  She is to use warm compresses the affected area to promote fluid drainage.  We will screen her STIs through blood.   Holly CHRISTELLA Fielding, DO [facility] [phone]

## 2023-09-03 NOTE — Patient Instructions (Signed)
 Take antibiotic with food to prevent nausea Use sunscreen while taking antibiotic Warm compresses 3 times daily to the area.

## 2023-09-04 LAB — HEPATITIS C ANTIBODY: Hep C Virus Ab: NONREACTIVE

## 2023-09-04 LAB — RPR: RPR Ser Ql: NONREACTIVE

## 2023-09-04 LAB — HSV 1 AND 2 AB, IGG
HSV 1 Glycoprotein G Ab, IgG: NONREACTIVE
HSV 2 IgG, Type Spec: NONREACTIVE

## 2023-09-04 LAB — HIV ANTIBODY (ROUTINE TESTING W REFLEX): HIV Screen 4th Generation wRfx: NONREACTIVE

## 2023-09-05 ENCOUNTER — Ambulatory Visit: Payer: Self-pay | Admitting: Family Medicine

## 2024-02-15 ENCOUNTER — Encounter: Payer: Self-pay | Admitting: Physician Assistant

## 2024-02-15 ENCOUNTER — Ambulatory Visit: Admitting: Physician Assistant

## 2024-02-15 VITALS — BP 93/60

## 2024-02-15 DIAGNOSIS — L700 Acne vulgaris: Secondary | ICD-10-CM | POA: Diagnosis not present

## 2024-02-15 DIAGNOSIS — D239 Other benign neoplasm of skin, unspecified: Secondary | ICD-10-CM

## 2024-02-15 DIAGNOSIS — D2372 Other benign neoplasm of skin of left lower limb, including hip: Secondary | ICD-10-CM | POA: Diagnosis not present

## 2024-02-15 MED ORDER — EPIDUO 0.1-2.5 % EX GEL: 1.0000 | Freq: Every morning | CUTANEOUS | 4 refills | Status: AC

## 2024-02-15 NOTE — Patient Instructions (Signed)

## 2024-02-15 NOTE — Progress Notes (Signed)
   New Patient Visit   Subjective  Holly Ortega is a 24 y.o. female NEW PATIENT who presents for the following: Acne of face x ~1 year. She has not used and OTC or prescription medications.  She also has a spot of her right calf that has been there 2-3 years. It itches sometimes and it gets sore.    The following portions of the chart were reviewed this encounter and updated as appropriate: medications, allergies, medical history  Review of Systems:  No other skin or systemic complaints except as noted in HPI or Assessment and Plan.  Objective  Well appearing patient in no apparent distress; mood and affect are within normal limits.   A focused examination was performed of the following areas: Face, left leg   Relevant exam findings are noted in the Assessment and Plan.    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  Treatment Plan: A dermatofibroma is a benign growth possibly related to trauma, such as an insect bite, cut from shaving, or inflamed acne-type bump.  Treatment options to remove include shave or excision with resulting scar and risk of recurrence.  Since benign-appearing and not bothersome, will observe for now.     ACNE VULGARIS   DERMATOFIBROMA    Return if symptoms worsen or fail to improve.  I, Holly Ortega, CMA, am acting as scribe for Franklyn Cafaro K, PA-C .   Documentation: I have reviewed the above documentation for accuracy and completeness, and I agree with the above.  Holly Ortega K, PA-C

## 2024-02-25 ENCOUNTER — Encounter: Payer: Self-pay | Admitting: Physician Assistant

## 2024-02-28 ENCOUNTER — Other Ambulatory Visit: Payer: Self-pay

## 2024-02-28 MED ORDER — ADAPALENE-BENZOYL PEROXIDE 0.1-2.5 % EX GEL: 1.0000 "application " | Freq: Every day | CUTANEOUS | 4 refills | Status: AC
